# Patient Record
Sex: Female | Born: 1970 | Hispanic: No | Marital: Married | Smoking: Never smoker
Health system: Southern US, Community
[De-identification: ages and names within clinical notes are randomized; demographics above are authoritative.]

## PROBLEM LIST (undated history)

## (undated) DIAGNOSIS — K635 Polyp of colon: Secondary | ICD-10-CM

## (undated) HISTORY — DX: Polyp of colon: K63.5

---

## 2002-07-21 ENCOUNTER — Other Ambulatory Visit: Admission: RE | Admit: 2002-07-21 | Discharge: 2002-07-21 | Payer: Self-pay | Admitting: *Deleted

## 2002-11-27 ENCOUNTER — Inpatient Hospital Stay (HOSPITAL_COMMUNITY): Admission: AD | Admit: 2002-11-27 | Discharge: 2003-01-01 | Payer: Self-pay | Admitting: *Deleted

## 2002-11-29 ENCOUNTER — Encounter: Payer: Self-pay | Admitting: Obstetrics and Gynecology

## 2002-12-02 ENCOUNTER — Encounter: Payer: Self-pay | Admitting: Obstetrics and Gynecology

## 2002-12-05 ENCOUNTER — Encounter: Payer: Self-pay | Admitting: Obstetrics and Gynecology

## 2002-12-06 ENCOUNTER — Encounter: Payer: Self-pay | Admitting: Obstetrics and Gynecology

## 2002-12-07 ENCOUNTER — Encounter: Payer: Self-pay | Admitting: Obstetrics and Gynecology

## 2002-12-08 ENCOUNTER — Encounter: Payer: Self-pay | Admitting: Obstetrics and Gynecology

## 2002-12-09 ENCOUNTER — Encounter: Payer: Self-pay | Admitting: Obstetrics and Gynecology

## 2002-12-10 ENCOUNTER — Encounter: Payer: Self-pay | Admitting: Obstetrics and Gynecology

## 2002-12-11 ENCOUNTER — Encounter: Payer: Self-pay | Admitting: Obstetrics and Gynecology

## 2002-12-12 ENCOUNTER — Encounter: Payer: Self-pay | Admitting: Obstetrics and Gynecology

## 2002-12-13 ENCOUNTER — Encounter: Payer: Self-pay | Admitting: Obstetrics and Gynecology

## 2002-12-14 ENCOUNTER — Encounter: Payer: Self-pay | Admitting: *Deleted

## 2002-12-15 ENCOUNTER — Encounter: Payer: Self-pay | Admitting: Obstetrics and Gynecology

## 2002-12-16 ENCOUNTER — Encounter: Payer: Self-pay | Admitting: *Deleted

## 2002-12-17 ENCOUNTER — Encounter: Payer: Self-pay | Admitting: Obstetrics and Gynecology

## 2002-12-18 ENCOUNTER — Encounter: Payer: Self-pay | Admitting: Obstetrics and Gynecology

## 2002-12-19 ENCOUNTER — Encounter: Payer: Self-pay | Admitting: Obstetrics and Gynecology

## 2002-12-20 ENCOUNTER — Encounter: Payer: Self-pay | Admitting: Obstetrics and Gynecology

## 2002-12-21 ENCOUNTER — Encounter: Payer: Self-pay | Admitting: *Deleted

## 2002-12-22 ENCOUNTER — Encounter: Payer: Self-pay | Admitting: *Deleted

## 2002-12-23 ENCOUNTER — Encounter: Payer: Self-pay | Admitting: *Deleted

## 2002-12-24 ENCOUNTER — Encounter: Payer: Self-pay | Admitting: *Deleted

## 2002-12-25 ENCOUNTER — Encounter: Payer: Self-pay | Admitting: *Deleted

## 2002-12-28 ENCOUNTER — Encounter: Payer: Self-pay | Admitting: *Deleted

## 2002-12-29 ENCOUNTER — Encounter: Payer: Self-pay | Admitting: *Deleted

## 2002-12-29 ENCOUNTER — Encounter (INDEPENDENT_AMBULATORY_CARE_PROVIDER_SITE_OTHER): Payer: Self-pay | Admitting: Specialist

## 2003-01-02 ENCOUNTER — Encounter: Admission: RE | Admit: 2003-01-02 | Discharge: 2003-02-01 | Payer: Self-pay | Admitting: *Deleted

## 2003-01-29 ENCOUNTER — Other Ambulatory Visit: Admission: RE | Admit: 2003-01-29 | Discharge: 2003-01-29 | Payer: Self-pay | Admitting: *Deleted

## 2004-01-30 ENCOUNTER — Other Ambulatory Visit: Admission: RE | Admit: 2004-01-30 | Discharge: 2004-01-30 | Payer: Self-pay | Admitting: *Deleted

## 2008-10-31 ENCOUNTER — Ambulatory Visit (HOSPITAL_COMMUNITY): Admission: RE | Admit: 2008-10-31 | Discharge: 2008-10-31 | Payer: Self-pay | Admitting: Family Medicine

## 2010-11-06 ENCOUNTER — Emergency Department (HOSPITAL_COMMUNITY)
Admission: EM | Admit: 2010-11-06 | Discharge: 2010-11-07 | Disposition: A | Discharge status: Home / Self Care | Payer: Self-pay | Attending: Emergency Medicine | Admitting: Emergency Medicine

## 2010-11-06 DIAGNOSIS — R05 Cough: Secondary | ICD-10-CM | POA: Insufficient documentation

## 2010-11-06 DIAGNOSIS — R059 Cough, unspecified: Secondary | ICD-10-CM | POA: Insufficient documentation

## 2010-11-06 DIAGNOSIS — J40 Bronchitis, not specified as acute or chronic: Secondary | ICD-10-CM | POA: Insufficient documentation

## 2010-11-06 LAB — URINALYSIS, ROUTINE W REFLEX MICROSCOPIC
Nitrite: NEGATIVE
Specific Gravity, Urine: >1.03 — ABNORMAL HIGH (ref 1.005–1.030)
Urine Glucose, Fasting: NEGATIVE mg/dL
pH: 5 (ref 5.0–8.0)

## 2010-11-06 LAB — URINE MICROSCOPIC-ADD ON

## 2010-11-06 LAB — COMPREHENSIVE METABOLIC PANEL
Albumin: 4 g/dL (ref 3.5–5.2)
BUN: 9 mg/dL (ref 6–23)
Calcium: 9.4 mg/dL (ref 8.4–10.5)
Creatinine, Ser: 0.74 mg/dL (ref 0.4–1.2)
Potassium: 3.2 mEq/L — ABNORMAL LOW (ref 3.5–5.1)
Total Protein: 7.6 g/dL (ref 6.0–8.3)

## 2010-11-06 LAB — CBC
MCH: 31.4 pg (ref 26.0–34.0)
MCHC: 37.1 g/dL — ABNORMAL HIGH (ref 30.0–36.0)
MCV: 84.5 fL (ref 78.0–100.0)
Platelets: 201 10*3/uL (ref 150–400)
RDW: 12.6 % (ref 11.5–15.5)
WBC: 22.1 10*3/uL — ABNORMAL HIGH (ref 4.0–10.5)

## 2010-11-06 LAB — DIFFERENTIAL
Basophils Absolute: 0 10*3/uL (ref 0.0–0.1)
Eosinophils Absolute: 0 10*3/uL (ref 0.0–0.7)
Eosinophils Relative: 0 % (ref 0–5)
Lymphocytes Relative: 4 % — ABNORMAL LOW (ref 12–46)
Lymphs Abs: 0.9 10*3/uL (ref 0.7–4.0)
Monocytes Absolute: 1.3 10*3/uL — ABNORMAL HIGH (ref 0.1–1.0)
Monocytes Relative: 6 % (ref 3–12)
Neutrophils Relative %: 90 % — ABNORMAL HIGH (ref 43–77)

## 2010-11-08 ENCOUNTER — Emergency Department (HOSPITAL_COMMUNITY): Admit: 2010-11-08 | Discharge: 2010-11-08 | Disposition: A | Discharge status: Home / Self Care | Payer: Self-pay

## 2010-11-08 ENCOUNTER — Inpatient Hospital Stay (HOSPITAL_COMMUNITY)
Admission: EM | Admit: 2010-11-08 | Discharge: 2010-11-11 | DRG: 603 | Disposition: A | Discharge status: Home / Self Care | Payer: Self-pay | Attending: Internal Medicine | Admitting: Internal Medicine

## 2010-11-08 DIAGNOSIS — L988 Other specified disorders of the skin and subcutaneous tissue: Secondary | ICD-10-CM | POA: Diagnosis present

## 2010-11-08 DIAGNOSIS — E871 Hypo-osmolality and hyponatremia: Secondary | ICD-10-CM | POA: Diagnosis present

## 2010-11-08 DIAGNOSIS — D649 Anemia, unspecified: Secondary | ICD-10-CM | POA: Diagnosis present

## 2010-11-08 DIAGNOSIS — E876 Hypokalemia: Secondary | ICD-10-CM | POA: Diagnosis present

## 2010-11-08 DIAGNOSIS — L02419 Cutaneous abscess of limb, unspecified: Principal | ICD-10-CM | POA: Diagnosis present

## 2010-11-08 DIAGNOSIS — J209 Acute bronchitis, unspecified: Secondary | ICD-10-CM | POA: Diagnosis present

## 2010-11-08 DIAGNOSIS — L270 Generalized skin eruption due to drugs and medicaments taken internally: Secondary | ICD-10-CM | POA: Diagnosis not present

## 2010-11-08 DIAGNOSIS — T368X5A Adverse effect of other systemic antibiotics, initial encounter: Secondary | ICD-10-CM | POA: Diagnosis not present

## 2010-11-08 LAB — BASIC METABOLIC PANEL
BUN: 5 mg/dL — ABNORMAL LOW (ref 6–23)
Creatinine, Ser: 0.65 mg/dL (ref 0.4–1.2)
GFR calc non Af Amer: >60 mL/min (ref >60)
Glucose, Bld: 128 mg/dL — ABNORMAL HIGH (ref 70–99)

## 2010-11-08 LAB — DIFFERENTIAL
Basophils Relative: 0 % (ref 0–1)
Eosinophils Absolute: 0 10*3/uL (ref 0.0–0.7)
Eosinophils Relative: 0 % (ref 0–5)
Monocytes Relative: 3 % (ref 3–12)
Neutrophils Relative %: 93 % — ABNORMAL HIGH (ref 43–77)

## 2010-11-08 LAB — CBC
MCH: 31.2 pg (ref 26.0–34.0)
MCV: 85.5 fL (ref 78.0–100.0)
Platelets: 187 10*3/uL (ref 150–400)
RBC: 4.42 MIL/uL (ref 3.87–5.11)
RDW: 13.3 % (ref 11.5–15.5)

## 2010-11-08 NOTE — H&P (Signed)
[name], [name]            ACCOUNT NO.:  [id number]  MEDICAL RECORD NO.:  [id number]           PATIENT TYPE:  E  LOCATION:  [facility]                          FACILITY:  [facility]  PHYSICIAN:  Elliot Cousin, M.D.    DATE OF BIRTH:  01/22/1971  DATE OF ADMISSION:  11/08/2010 DATE OF DISCHARGE:  LH                             HISTORY & PHYSICAL   PRIMARY CARE PHYSICIAN:  [name], MD  CHIEF COMPLAINT:  Left leg redness, swelling, and pain.  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  Her symptoms started 2 days ago.  She presented to the Emergency Department at that time for mild left ankle redness and swelling.  She also complained of left inner thigh discomfort.  She does not recall complaining of a cough.  However, the Emergency Department physician diagnosed her with bronchitis and discharged her to home on ibuprofen and azithromycin.  The patient has no complaints of cough, chest pain or pleurisy at this time.  However, over the past 48 hours, her left leg has become more swollen, more painful, and more red.  The redness has now extended from the left foot to the knee and then to the inner thigh.  She has no pain at rest, however when she stands or attempts to ambulate, she experiences an achy pain which she rates as 7/10 in intensity.  The ibuprofen has helped. She received azithromycin in the Emergency Department 2 days ago.  She has taken only one azithromycin tablet that was prescribed yesterday. She has not taken any medications today.  Several days ago, she did have subjective fever and chills.  No associated nausea, vomiting, diarrhea, abdominal pain, headache, or pain with urination.  Of note, when questioned, the patient had been filing and  clipping her toenails.  The left fourth toe is slightly painful and swollen.  There was some mild bleeding around the cuticle several days ago when she clipped her toenails.  The patient presented to her primary care physician today in his office. He was concerned about a DVT.  He recommended that she come to the Emergency Department for further evaluation.  A lower extremity venous ultrasound was ordered and it was negative for DVT.  Currently, the patient is afebrile and hemodynamically stable.  Her white blood cell count is 16.5.  Her serum sodium is 133.  Her serum potassium is 3.4. She is being admitted for further evaluation and management.  PAST MEDICAL HISTORY: 1. 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 2. History of therapeutic abortion with secondary bleeding, status     post transfusion in 1998. 3. Status post C-section in March 2004.  MEDICATIONS: 1. Ibuprofen 800 mg 3 times daily as needed for pain and fever. 2. Azithromycin 250 mg daily.  ALLERGIES:  The patient has an allergy to PENICILLIN.  She was told byher mother that she had convulsions after taking PENICILLIN.  SOCIAL  HISTORY:  [place]  She is married.  She has 1 daughter.  She is employed at a [employer]. She denies tobacco, alcohol, and illicit drug use.  FAMILY HISTORY:  Her mother is 54 years of age and has no significant health problems.  Her father is 79 years of age and has no significant health problems.  REVIEW OF SYSTEMS:  As above in history present illness.  Otherwise review of systems is negative.  PHYSICAL EXAMINATION:  VITAL SIGNS:  Temperature 99.4, blood pressure 113/55, pulse 103 rechecked at 90 beats per minute, respiratory rate 17, and oxygen saturation 99% on room air. GENERAL:  The patient is a pleasant 40 year old Latino woman who is currently sitting up in bed in no acute distress. HEENT:  Head is normocephalic, nontraumatic.  Pupils  are equal, round, and reactive to light.  Extraocular muscles are intact.  Conjunctivae are clear.  Sclerae are white.  Tympanic membranes are not examined. Nasal mucosa is mildly dry.  No sinus tenderness.  Oropharynx reveals slightly dry mucous membranes.  No posterior exudates or erythema. NECK:  Supple.  No adenopathy.  No thyromegaly.  No bruit.  No JVD. LUNGS:  Clear to auscultation bilaterally. HEART:  S1, S2 with no murmurs, rubs, or gallops. ABDOMEN:  Positive bowel sounds, soft, nontender, and nondistended.  No hepatosplenomegaly.  No masses palpated. GENITOURINARY:  Deferred. RECTAL:  Deferred. EXTREMITIES:  Right lower extremity without any abnormalities.  Pedal pulse is palpable.  No pretibial or pedal edema.  The left leg has a large patch of erythema located at the inner thigh.  There is erythema over the left foot radiating to the pretibial area and the calf area. There is 2+ to 3+ pedal edema and approximately 1+ pretibial edema.  Her left leg is warm to touch.  Mild tenderness over the dorsum and the ankle of the foot.  Pedal pulse is barely palpable.  She is able to flex and extend the foot with some discomfort.  Of note, the patient's left fourth toe is mildly erythematous and edematous.  The cuticle on the toe is red and inflamed.  In fact, there are inflamed cuticles on all of the toes of her left foot. NEUROLOGIC:  The patient is alert and oriented x3.  Cranial nerves II through XII are intact.  Strength is 5/5 throughout with exception of the left leg, which was not tested for strength due to discomfort. Sensation is grossly intact.  ADMISSION IMAGING DATA:  Left lower extremity venous duplex ultrasound. No evidence of deep vein thrombosis.  ADMISSION LABORATORY DATA:  WBC 16.4, hemoglobin 13.8, and platelet count 187.  Sodium 133, potassium 3.4, chloride 104, CO2 of 21, glucose 128, BUN 5, creatinine 0.65, and calcium 9.1.  ASSESSMENT: 1. Left lower  extremity cellulitis.  The impetus or nidus of infection     is likely from the mildly infected left fourth toe, which has some     surrounding erythema.  The patient's cuticles are all mildly     erythematous on her left foot.  She was obviously filing and/or     clipping her toenails too close to the cuticle.  The patient was     informed of this.  She does have a leukocytosis.  Her white blood     cell count is currently 16.4.  Two days ago, it was 22.1.  She was     started on treatment with azithromycin and ibuprofen for what was  diagnosed as an acute bronchitis, although the patient has no upper     respiratory symptoms or lower respiratory symptoms now.  She has     been on azithromycin for the past 2 days.  Objectively, she is not     febrile, although she did have subjective fever and chills several     days ago. 2. Hyponatremia and hypokalemia.  The patient's serum sodium is 133     and her serum potassium is 3.4.  She may be mildly volume depleted. 3. Left lower extremity edema, secondary to cellulitis.  The venous     ultrasound was negative for deep vein thrombosis.  PLAN: 1. The patient was given intravenous clindamycin by the Emergency     Department physician Dr. Effie Shy.  We will discontinue clindamycin in     favor of treatment with IV Levaquin and vancomycin. 2. We will replete her potassium chloride orally and in the IV fluids.     We will gently hydrate her. 3. Supportive treatment.     Elliot Cousin, M.D.     DF/MEDQ  D:  11/08/2010  T:  11/08/2010  Job:  [id number]  cc:   [name], M.D. Fax: [phone]  Electronically Signed by Elliot Cousin M.D. on 11/08/2010 06:40:34 PM

## 2010-11-09 LAB — COMPREHENSIVE METABOLIC PANEL
ALT: 28 U/L (ref 0–35)
AST: 17 U/L (ref 0–37)
Albumin: 2.7 g/dL — ABNORMAL LOW (ref 3.5–5.2)
Alkaline Phosphatase: 174 U/L — ABNORMAL HIGH (ref 39–117)
BUN: 6 mg/dL (ref 6–23)
Chloride: 106 mEq/L (ref 96–112)
GFR calc Af Amer: >60 mL/min (ref >60)
Potassium: 3.9 mEq/L (ref 3.5–5.1)
Sodium: 134 mEq/L — ABNORMAL LOW (ref 135–145)
Total Bilirubin: 1.2 mg/dL (ref 0.3–1.2)
Total Protein: 5.9 g/dL — ABNORMAL LOW (ref 6.0–8.3)

## 2010-11-09 LAB — DIFFERENTIAL
Basophils Relative: 0 % (ref 0–1)
Eosinophils Absolute: 0 10*3/uL (ref 0.0–0.7)
Eosinophils Relative: 0 % (ref 0–5)
Lymphs Abs: 1.2 10*3/uL (ref 0.7–4.0)
Monocytes Absolute: 0.5 10*3/uL (ref 0.1–1.0)
Monocytes Relative: 6 % (ref 3–12)
Neutrophils Relative %: 79 % — ABNORMAL HIGH (ref 43–77)

## 2010-11-09 LAB — CBC
MCH: 31.2 pg (ref 26.0–34.0)
MCHC: 36.3 g/dL — ABNORMAL HIGH (ref 30.0–36.0)
MCV: 86 fL (ref 78.0–100.0)
Platelets: 168 10*3/uL (ref 150–400)
RBC: 3.43 MIL/uL — ABNORMAL LOW (ref 3.87–5.11)

## 2010-11-09 LAB — TSH: TSH: <0.008 u[IU]/mL — ABNORMAL LOW (ref 0.350–4.500)

## 2010-11-10 LAB — CBC
Hemoglobin: 11.1 g/dL — ABNORMAL LOW (ref 12.0–15.0)
MCH: 31.4 pg (ref 26.0–34.0)
MCHC: 36.8 g/dL — ABNORMAL HIGH (ref 30.0–36.0)
MCV: 85.3 fL (ref 78.0–100.0)
Platelets: 196 10*3/uL (ref 150–400)
RBC: 3.54 MIL/uL — ABNORMAL LOW (ref 3.87–5.11)

## 2010-11-10 LAB — TSH: TSH: <0.008 u[IU]/mL — ABNORMAL LOW (ref 0.350–4.500)

## 2010-11-10 LAB — BASIC METABOLIC PANEL
BUN: 5 mg/dL — ABNORMAL LOW (ref 6–23)
CO2: 23 mEq/L (ref 19–32)
Calcium: 8.6 mg/dL (ref 8.4–10.5)
Chloride: 110 mEq/L (ref 96–112)
Creatinine, Ser: 0.52 mg/dL (ref 0.4–1.2)

## 2010-11-10 LAB — DIFFERENTIAL
Eosinophils Absolute: 0.1 10*3/uL (ref 0.0–0.7)
Lymphs Abs: 1.7 10*3/uL (ref 0.7–4.0)
Monocytes Absolute: 0.6 10*3/uL (ref 0.1–1.0)
Monocytes Relative: 8 % (ref 3–12)
Neutrophils Relative %: 68 % (ref 43–77)

## 2010-11-10 LAB — IRON AND TIBC: UIBC: 213 ug/dL

## 2010-11-10 LAB — FERRITIN: Ferritin: 143 ng/mL (ref 10–291)

## 2010-11-13 LAB — CULTURE, BLOOD (ROUTINE X 2): Culture: NO GROWTH

## 2010-11-26 NOTE — Discharge Summary (Signed)
  [name]            ACCOUNT NO.:  [id number]  MEDICAL RECORD NO.:  [id number]           PATIENT TYPE:  I  LOCATION:  A325                          FACILITY:  [facility]  PHYSICIAN:  Marinda Elk, M.D.DATE OF BIRTH:  [birth year]  DATE OF ADMISSION:  11/08/2010 DATE OF DISCHARGE:  02/07/2012LH                              DISCHARGE SUMMARY   PRIMARY CARE DOCTOR:  [name], MD  DISCHARGE DIAGNOSIS: 1. Lower extremity cellulitis. 2. Acute bronchitis. 3. Hyponatremia. 4. Diffuse pruritus and skin rash, probably secondary to vancomycin,     questionable Red man syndrome. 5. Anemia.  DISCHARGE MEDICATIONS: 1. Tylenol 325 mg p.o. 650 mg daily. 2. Levaquin 500 mg p.o. daily. 3. Ibuprofen 800 mg 1 tab t.i.d. p.r.n.  PROCEDURES PERFORMED:  Doppler extremity showed no evidence of DVT.  BRIEF ADMITTING HISTORY AND PHYSICAL:  This is a 40 year old female with past medical history positive for PE, negative chest x-ray in 1998, who presents to emergency room with a chief complaint of left lower extremity swelling.  Since it started 2 days ago, he presented to the ED with a redness and ankle swelling, she complained also on the inner thigh.  She does not recall at calf.  She was diagnosed with bronchitis, given azithromycin and ibuprofen and discharged home.  Please refer to November 08, 2010, for further details.  ASSESSMENT AND PLAN: 1. Left lower extremity cellulitis.  On admission, she was started on     vancomycin.  She developed a rash and pruritus, diffuse.  This is     probably Red man syndrome.  This was stopped.  She was given     Benadryl with improvement, unsure if she has an allergy to     vancomycin.  So, she was put on Levaquin which she will continue     for 7 days.  Her redness had decreased.  Her inner thigh redness     resolved.  She is able to walk without any problem. 2. Acute bronchitis.  She came in with azithromycin.  This was  discontinued.  Levaquin should cover.  She was satting 97% on room     air. 3. Hyponatremia, this is probably secondary to decreased p.o. intake.     She was given IV fluids and this resolved.  On the day of discharge, temperature is 98, pulse of 88, respirations 20, blood pressure 106/60, she was satting 97% on room air.  LABS ON THE DAY OF DISCHARGE:  None.     Marinda Elk, M.D.     AF/MEDQ  D:  11/11/2010  T:  11/12/2010  Job:  [id number]  cc:   [name], M.D. Fax: [phone]  Electronically Signed by Lambert Keto M.D. on 11/26/2010 10:17:04 AM

## 2011-02-20 NOTE — Discharge Summary (Signed)
[name], [name]                        ACCOUNT NO.:  [id number]   MEDICAL RECORD NO.:  [id number]                   PATIENT TYPE:  INP   LOCATION:  9143                                 FACILITY:  [facility]   PHYSICIAN:  [place] B. Earlene Plater, M.D.               DATE OF BIRTH:  [birth year]   DATE OF ADMISSION:  11/27/2002  DATE OF DISCHARGE:  01/01/2003                                 DISCHARGE SUMMARY   ADMISSION DIAGNOSES:  1. Twenty-seven and four-sevenths weeks intrauterine pregnancy.  2. Oligohydramnios.  3. History of elevated maternal serum alpha-fetoprotein.   DISCHARGE DIAGNOSES:  1. Twenty-seven and four-sevenths weeks intrauterine pregnancy.  2. Oligohydramnios.  3. History of elevated maternal serum alpha-fetoprotein.  4. Preterm premature rupture of membranes.  5. Preterm labor.  6. Breech presentation.   PROCEDURE:  1. Admission for expectant management of oligohydramnios and subsequent     rupture of membranes.  2. Primary cesarean section for preterm labor with breech presentation.   HISTORY OF PRESENT ILLNESS:  For complete details please see the History and  Physical on the chart.  Briefly, the patient presented as a 40 year old  Hispanic female gravida 2 para 0 at 69 and four-sevenths weeks with  worsening oligohydramnios.  Had a history of first trimester bleeding with a  large subchorionic hemorrhage, and an abnormal triple screen with an alpha-  fetoprotein of 3.9 multiples of the median.  Subsequent ultrasound showed  severe oligohydramnios and otherwise normal anatomical survey.  The patient  had declined amniocentesis and was counseled regarding the risks of  continued pregnancy at [facility] by Dr. Kirby Funk.  Follow-up  ultrasound did show interval growth and eventual collection of a small  amount of amniotic fluid which peaked at an AFI of 7 and subsequently  diminished to an AFI of 4 which was criteria for admission.  The patient had  intermittently complained of leakage of fluid but on multiple sterile  speculum exams had never been shown to have definitive evidence of ruptured  membranes from 16 weeks on.   HOSPITAL COURSE:  The patient was admitted and managed with bedrest,  hydration, and daily fetal monitoring which was reassuring.  Ultrasound did  show interval growth and stabilization of her amniotic fluid index.   On hospital day #6 the patient noted dramatic increase in leakage of fluid  and associated slight bleeding.  Sterile speculum exam was repeated and this  time confirmed by microscopy rupture of membranes.  The patient was treated  by the PPROM protocol with erythromycin and clindamycin as she had a severe  PENICILLIN allergy.  This was continued intravenous x2 days and oral x5  days.  The patient continued to be managed expectantly until hospital day  #33 at which time she was 32 weeks.  At that point she developed worsening  contractions, increasing bleeding, and was found to be in preterm labor with  breech presentation.  Therefore, was recommended the patient should deliver  by cesarean section.   The patient was subsequently delivered by a primary low transverse cesarean  section of a viable female infant, Apgars 8 and 9.  There was a partially  abrupted placenta with organized clot and partial placenta accreta which was  ultimately removed by sharp curettage and noted at the time of surgery.  There also was a question of a right uterine horn.  This was difficult to  determine definitively as the uterus could not be exteriorized due to an  abnormal size and configuration with an elongated apparent right uterine  horn.   Postoperatively the patient rapidly regained her ability to ambulate, void,  and tolerate a regular diet.  She was discharged to home on postoperative  day #3 in satisfactory condition.   DISCHARGE MEDICATIONS:  1. Tylox one to two p.o. q.4-6h. p.r.n. pain.  2. Prenatal  vitamins one daily.   FOLLOW-UP:  [facility] in four to six weeks.   DISCHARGE INSTRUCTIONS:  Standard booklet given prior to discharge.   CONDITION ON DISCHARGE:  Satisfactory.                                               Gerri Spore B. Earlene Plater, M.D.    WBD/MEDQ  D:  01/24/2003  T:  01/24/2003  Job:  [id number]

## 2011-02-20 NOTE — Op Note (Signed)
[name], [name]                        ACCOUNT NO.:  [id number]   MEDICAL RECORD NO.:  [id number]                   PATIENT TYPE:  INP   LOCATION:  9143                                 FACILITY:  [facility]   PHYSICIAN:  Aurelia B. Earlene Plater, M.D.               DATE OF BIRTH:  [birth year]   DATE OF PROCEDURE:  12/29/2002  DATE OF DISCHARGE:                                 OPERATIVE REPORT   PREOPERATIVE DIAGNOSES:  1. Thirty-two week intrauterine pregnancy.  2. Premature preterm rupture of membranes.  3. Preterm labor.  4. Breech presentation.  5. Vaginal bleeding.   POSTOPERATIVE DIAGNOSES:  1. Thirty-two week intrauterine pregnancy.  2. Premature preterm rupture of membranes.  3. Preterm labor.  4. Breech presentation.  5. Vaginal bleeding.   PROCEDURE:  Primary low transverse cesarean section.   SURGEON:  Chester Holstein. Earlene Plater, M.D.   ASSISTANT:  Thamas Jaegers, MS 3.   ANESTHESIA:  Spinal.   FINDINGS:  Viable female infant, Apgars 8 and 9.  Uterus with questionable  right uterine horn, placenta accreta within the area of the presumed right  uterine horn, and clinically abruptio placentae at an area of organized old  apparent clot was noted when the placenta was removed.  Tubes and ovaries  not well visualized.   ESTIMATED BLOOD LOSS:  500 cc.   COMPLICATIONS:  None.   INDICATIONS FOR PROCEDURE:  The patient had been hospitalization for acute  problem of oligohydramnios status post ___________ now at 32 weeks' with  increasing contractions today.  Had previously had a long closed cervix by  speculum exam.  Contractions were noted to increase throughout the day today  to every two minutes with associated heavy vaginal bleeding.  Was found to  be 1 cm dilated, completely effaced, and had previously been verified as  breech earlier in the day on ultrasound.  Therefore, taken for a primary  cesarean section.   PROCEDURE:  The patient was taken to the operating room and  spinal  anesthesia obtained.  She was placed in the supine position with leftward  tilt and prepped and draped in standard fashion.  A Foley catheter was  placed into the bladder.   A Pfannenstiel incision was made with a knife and carried sharply to the  underlying fascia.  The fascia was divided in the midline, extended  laterally with Mayo scissors.  Kocher clamps used to elevate the superior  aspect of the incision and underlying rectus muscles dissected off sharply,  repeated ___________ in similar fashion.   The midline was identified at the superior aspect of the incision and  peritoneum entered sharply after elevation with hemostats, extended  inferiorly with the Bovie, bladder blade inserted, vesicouterine peritoneum  identified, bladder flap created with sharp and blunt technique.  The  bladder blade reinserted and the uterine incision made in a low transverse  fashion with a knife, extended laterally with bandage  scissors.  At  amniotomy, clear fluid noted.   The breech was elevated through the incision and delivered to the level of  the knees.  The knees were then flexed to deliver the lower extremities.  Traction was placed at the posterior, superior iliac crests  to deliver the  fetus to the level of the scapulae.  Each arm was delivered by collection at  the elbow.  The head was delivered by flexion at the neck.  Care was taken  to avoid hyperextension of the neck.  The nose and mouth suctioned with the  bulb and the infant handed off to waiting pediatricians.  Clindamycin 900 mg  IV was given at cord clamp due to penicillin allergy.   The uterus could not be exteriorized due to an abnormal size and  configuration with an elongated apparent right uterine horn.  This was where  the placenta was located and it was abnormally implanted.  Clinically,  diagnosis is of a placenta accreta.  This was removed with ring forceps with  significant effort necessary.  The #12 suction  cannula was placed up into  the horn and suction revealed no additional tissue returning.  It was gently  curetted and a gritty texture noted.  There was no excessive bleeding.   The uterine incision was free of extension.  It was closed in a running-  locked fashion with 0 Monocryl.  A second imbricating layer placed over the  same suture.   The pelvis was irrigated, the uterine incision inspected and was hemostatic,  as was the bladder flap.  The subfascial space was also hemostatic.   The fascia was closed with running stitch of 0 Vicryl.  The subcutaneous  tissue was irrigated and made hemostatic with the Bovie.  The skin was  closed with staples.   The patient tolerated the procedure well, there were no complications.  She  was taken to the recovery room awake, alert, and in stable condition.  All  counts were correct per the operating staff.                                               Gerri Spore B. Earlene Plater, M.D.    WBD/MEDQ  D:  12/29/2002  T:  12/30/2002  Job:  [id number]

## 2011-02-20 NOTE — H&P (Signed)
[name], [name]                        ACCOUNT NO.:  [id number]   MEDICAL RECORD NO.:  [id number]                   PATIENT TYPE:  INP   LOCATION:  9156                                 FACILITY:  [facility]   PHYSICIAN:  Lenoard Aden, M.D.             DATE OF BIRTH:  08/20/1971   DATE OF ADMISSION:  11/27/2002  DATE OF DISCHARGE:                                HISTORY & PHYSICAL   CHIEF COMPLAINT:  Oligohydramnios.   HISTORY OF PRESENT ILLNESS:  The patient is a 40 year old, Hispanic female,  G2, P0 with EDD of Feb 23, 2003, with an estimated gestational age of 27-4/7  weeks who presents with worsening oligohydramnios.   MEDICATIONS:  Prenatal vitamins.   ALLERGIES:  PENICILLIN.   PRENATAL LABORATORY DATA:  Blood type B positive.  Rh antibody negative.  Hemoglobin electrophoresis within normal limits.  Rubella immune.  Hepatitis  B surface antigen negative.  HIV nonreactive.   PAST MEDICAL HISTORY:  1. 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  No other medical     hospitalizations.  2. History of a TAB with secondary bleeding done in 1998.  3. Family history of insulin-dependent diabetes and chronic hypertension.  4. The patient did have a blood transfusion after bleeding complications     from her abortion in 1998.   PRENATAL COURSE:  Complicated by first-trimester bleeding with a large  subchorionic hemorrhage which resolved.  History of abnormal AFP of 3.9.  Subsequent targeted ultrasound revealed severe oligohydramnios and otherwise  normal, but suboptimal anatomical survey.  The patient declined  amniocentesis at that time.  Repeat ultrasound in this office performed on  November 06, 2002, reveals an estimated fetal weight in the 25th percentile  of 1 pound 8 ounces with AFI of 7 with left fetal renal pyelectasis of 0.6  cm noted.  Repeat ultrasound today reveals an AFI of 4.   PHYSICAL EXAMINATION:  GENERAL:  Well-developed, well-nourished,  Hispanic  female in no apparent distress.  VITAL SIGNS:  Blood pressure 100/60, weight 151 pounds.  HEENT:  Normal.  LUNGS:  Clear.  HEART:  Regular rate and rhythm.  ABDOMEN:  Soft, gravid to approximately 21-22 weeks, nontender.  PELVIC:  Cervix per nurse practitioner is closed, approximately 80% effaced  and -1 station.  Ultrasound today reveals an AFI of 4 and a frank breech  presenting fetus.  EXTREMITIES:  No cords.  NEUROLOGIC:  Nonfocal.   IMPRESSION:  1. A 27-4/7 weeks intrauterine pregnancy.  2. Homero Fellers breech presentation.  3. Oligohydramnios.  4. History of abnormal alpha fetoprotein with suboptimal target ultrasound.  5. History of tuberculosis with negative chest x-ray.  6. History of first-trimester bleeding.   PLAN:  1. Admit to [facility] for continuous fetal monitoring.  2. Repeat AFI in 48 hours.  3. IV fluids and hydration.  4. Bed rest with bathroom privileges.  5. Treat bacterial vaginosis with Flagyl  500 mg b.i.d. x7 days.  6. Betamethasone 12.5 mg IM, repeat in 24 hours.                                               Lenoard Aden, M.D.    RJT/MEDQ  D:  11/27/2002  T:  11/27/2002  Job:  [id number]

## 2012-03-28 ENCOUNTER — Other Ambulatory Visit (HOSPITAL_COMMUNITY): Payer: Self-pay | Admitting: Family Medicine

## 2012-03-28 DIAGNOSIS — Z139 Encounter for screening, unspecified: Secondary | ICD-10-CM

## 2012-03-29 ENCOUNTER — Ambulatory Visit (HOSPITAL_COMMUNITY): Payer: Self-pay

## 2012-03-30 ENCOUNTER — Ambulatory Visit (HOSPITAL_COMMUNITY): Payer: Self-pay

## 2012-06-20 ENCOUNTER — Ambulatory Visit (HOSPITAL_COMMUNITY): Payer: Self-pay

## 2012-06-20 ENCOUNTER — Ambulatory Visit (HOSPITAL_COMMUNITY)
Admission: RE | Admit: 2012-06-20 | Discharge: 2012-06-20 | Disposition: A | Discharge status: Home / Self Care | Payer: BC Managed Care – PPO | Source: Ambulatory Visit | Attending: Family Medicine | Admitting: Family Medicine

## 2012-06-20 DIAGNOSIS — Z1231 Encounter for screening mammogram for malignant neoplasm of breast: Secondary | ICD-10-CM | POA: Insufficient documentation

## 2012-06-20 DIAGNOSIS — Z139 Encounter for screening, unspecified: Secondary | ICD-10-CM

## 2012-07-18 ENCOUNTER — Other Ambulatory Visit (HOSPITAL_COMMUNITY): Payer: Self-pay | Admitting: Family Medicine

## 2012-07-18 ENCOUNTER — Ambulatory Visit (HOSPITAL_COMMUNITY)
Admission: RE | Admit: 2012-07-18 | Discharge: 2012-07-18 | Disposition: A | Discharge status: Home / Self Care | Payer: BC Managed Care – PPO | Source: Ambulatory Visit | Attending: Family Medicine | Admitting: Family Medicine

## 2012-07-18 DIAGNOSIS — R1084 Generalized abdominal pain: Secondary | ICD-10-CM

## 2012-07-18 DIAGNOSIS — R109 Unspecified abdominal pain: Secondary | ICD-10-CM | POA: Insufficient documentation

## 2012-07-18 DIAGNOSIS — K5900 Constipation, unspecified: Secondary | ICD-10-CM | POA: Insufficient documentation

## 2012-12-02 ENCOUNTER — Encounter: Payer: Self-pay | Admitting: *Deleted

## 2012-12-02 ENCOUNTER — Telehealth: Payer: Self-pay | Admitting: Internal Medicine

## 2012-12-02 NOTE — Telephone Encounter (Signed)
Spoke with patient and scheduled her with Doug Sou, PA on 12/09/12 at 1:30 PM.

## 2012-12-09 ENCOUNTER — Ambulatory Visit: Payer: BC Managed Care – PPO | Admitting: Gastroenterology

## 2012-12-12 ENCOUNTER — Ambulatory Visit (INDEPENDENT_AMBULATORY_CARE_PROVIDER_SITE_OTHER): Payer: BC Managed Care – PPO | Admitting: Gastroenterology

## 2012-12-12 ENCOUNTER — Other Ambulatory Visit (INDEPENDENT_AMBULATORY_CARE_PROVIDER_SITE_OTHER): Payer: BC Managed Care – PPO

## 2012-12-12 VITALS — BP 102/72 | HR 71 | Ht 61.5 in | Wt 135.5 lb

## 2012-12-12 DIAGNOSIS — K625 Hemorrhage of anus and rectum: Secondary | ICD-10-CM

## 2012-12-12 DIAGNOSIS — K5900 Constipation, unspecified: Secondary | ICD-10-CM

## 2012-12-12 DIAGNOSIS — R194 Change in bowel habit: Secondary | ICD-10-CM

## 2012-12-12 DIAGNOSIS — R198 Other specified symptoms and signs involving the digestive system and abdomen: Secondary | ICD-10-CM

## 2012-12-12 MED ORDER — NA SULFATE-K SULFATE-MG SULF 17.5-3.13-1.6 GM/177ML PO SOLN: ORAL | Status: DC

## 2012-12-12 NOTE — Patient Instructions (Signed)
You have been scheduled for a colonoscopy with propofol. Please follow written instructions given to you at your visit today.  Please use the suprep kit you have been given today. If you use inhalers (even only as needed), please bring them with you on the day of your procedure.  Your physician has requested that you go to the basement for the following lab work before leaving today: TSH, IGA  Take your Miralax every day.

## 2012-12-12 NOTE — Progress Notes (Signed)
12/12/2012 [name] [id number] [birth year]   HISTORY OF PRESENT ILLNESS:  Patient is a pleasant 42 year old female who was referred by her PCP for evaluation of rectal bleeding and constipation.  Says that two years ago her bowels changed and she began having to strain to move her bowels.  She says that she usually has 2 BM's a day but it is hard for her to pass them.  She says that if she drinks a lot of water then it is a little better.  Her PCP also gave her a script for Miralax, which she took for a short period of time before discontinuing it.  Thinks that it may have helped to some degree.  A couple of months ago she was having some BRB on the TP.  Her PCP gave her anusol suppositories but referred her here for further evaluation.  Complains of intermittent mid-abdominal pains as well, mostly when she eats meat.  CBC, CMP, lipase, and celiac titers were normal recently.  Denies nausea and vomiting.  No upper GI complaints such has heartburn or reflux.   History reviewed. No pertinent past medical history. Past Surgical History  Procedure Laterality Date  . Cesarean section      reports that she has never smoked. She has never used smokeless tobacco. She reports that she does not drink alcohol or use illicit drugs. family history includes Diabetes in her brother and maternal grandmother. Allergies  Allergen Reactions  . Penicillins Hives      Outpatient Encounter Prescriptions as of 12/12/2012  Medication Sig Dispense Refill  . hydrocortisone (ANUSOL-HC) 2.5 % rectal cream Place 1 application rectally 2 (two) times daily.      . polyethylene glycol powder (GLYCOLAX/MIRALAX) powder Take 17 g by mouth daily.      . Na Sulfate-K Sulfate-Mg Sulf (SUPREP BOWEL PREP) SOLN Use as directed  2 Bottle  0   No facility-administered encounter medications on file as of 12/12/2012.     REVIEW OF SYSTEMS  : All other systems reviewed and negative except where noted in the History of Present  Illness.   PHYSICAL EXAM: BP 102/72  Pulse 71  Ht 5' 1.5" (1.562 m)  Wt 135 lb 8 oz (61.462 kg)  BMI 25.19 kg/m2 General: Well developed female in no acute distress Head: Normocephalic and atraumatic Eyes:  sclerae anicteric,conjunctive pink. Ears: Normal auditory acuity Lungs: Clear throughout to auscultation Heart: Regular rate and rhythm Abdomen: Soft, non-distended. No masses or hepatomegaly noted. Normal bowel sounds.  Mild mid-abdominal TTP without R/R/G. Rectal: Deferred.  Will be done at the time of colonoscopy. Musculoskeletal: Symmetrical with no gross deformities  Skin: No lesions on visible extremities Extremities: No edema  Neurological: Alert oriented x 4, grossly nonfocal. Psychological:  Alert and cooperative. Normal mood and affect  ASSESSMENT AND PLAN: -Rectal bleeding -Change of bowels with constipation since 2 years ago.  Better with increased fluids. -Mid-abdominal discomfort:  Likely has IBS/idiopathic constipation.  *Will schedule colonoscopy. *Check TSH since this has not been checked in two years (was normal at that time). *She will continue the increased fluids and will restart back on Miralax once daily. *

## 2012-12-13 ENCOUNTER — Encounter: Payer: Self-pay | Admitting: Gastroenterology

## 2012-12-13 ENCOUNTER — Telehealth: Payer: Self-pay

## 2012-12-13 ENCOUNTER — Ambulatory Visit (AMBULATORY_SURGERY_CENTER): Payer: BC Managed Care – PPO | Admitting: Gastroenterology

## 2012-12-13 ENCOUNTER — Encounter: Payer: Self-pay | Admitting: Internal Medicine

## 2012-12-13 VITALS — BP 106/71 | HR 65 | Temp 98.8°F | Resp 27 | Ht 61.0 in | Wt 135.0 lb

## 2012-12-13 DIAGNOSIS — R198 Other specified symptoms and signs involving the digestive system and abdomen: Secondary | ICD-10-CM

## 2012-12-13 DIAGNOSIS — D126 Benign neoplasm of colon, unspecified: Secondary | ICD-10-CM

## 2012-12-13 DIAGNOSIS — Z1211 Encounter for screening for malignant neoplasm of colon: Secondary | ICD-10-CM

## 2012-12-13 DIAGNOSIS — K625 Hemorrhage of anus and rectum: Secondary | ICD-10-CM

## 2012-12-13 DIAGNOSIS — K6289 Other specified diseases of anus and rectum: Secondary | ICD-10-CM

## 2012-12-13 MED ORDER — SODIUM CHLORIDE 0.9 % IV SOLN: 500.0000 mL | INTRAVENOUS | Status: DC

## 2012-12-13 NOTE — Progress Notes (Signed)
Called to room to assist during endoscopic procedure.  Patient ID and intended procedure confirmed with present staff. Received instructions for my participation in the procedure from the performing physician.  

## 2012-12-13 NOTE — Progress Notes (Signed)
Patient did not experience any of the following events: a burn prior to discharge; a fall within the facility; wrong site/side/patient/procedure/implant event; or a hospital transfer or hospital admission upon discharge from the facility. (G8907)Patient did not have preoperative order for IV antibiotic SSI prophylaxis. (G8918) EWM 

## 2012-12-13 NOTE — Telephone Encounter (Signed)
[facility] pharmacy called with question regarding suprep , canceled the rx with pharmacy, patient was given sample since having procedure 12/13/12.

## 2012-12-13 NOTE — Op Note (Signed)
[facility] [address] [place], [address]   COLONOSCOPY PROCEDURE REPORT  PATIENT: [name], [name]  MR#: [id number] BIRTHDATE: 06-07-1971 , 41  yrs. old GENDER: Female ENDOSCOPIST: Mardella Layman, MD, [facility] REFERRED BY:  Assunta Found, M.D. PROCEDURE DATE:  12/13/2012 PROCEDURE:   Colonoscopy with biopsy and snare polypectomy ASA CLASS:   Class II INDICATIONS:Rectal Bleeding and Average risk patient for colon cancer. MEDICATIONS: propofol (Diprivan) 300mg  IV  DESCRIPTION OF PROCEDURE:   After the risks and benefits and of the procedure were explained, informed consent was obtained.  A digital rectal exam revealed no abnormalities of the rectum.    The LB CF-H180AL [id number]  endoscope was introduced through the anus and advanced to the cecum, which was identified by both the appendix and ileocecal valve .  The quality of the prep was excellent, using MoviPrep .  The instrument was then slowly withdrawn as the colon was fully examined.     COLON FINDINGS: A smooth sessile very flat polyp ranging between 3-35mm in size was found at the cecum.  A polypectomy was performed. The resection was complete and the polyp tissue was completely retrieved.   Granular,red rectal mucosa biopsied.   The colon was otherwise normal.  There was no diverticulosis, inflammation, polyps or cancers unless previously stated.            The scope was then withdrawn from the patient and the procedure completed.  COMPLICATIONS: There were no complications. ENDOSCOPIC IMPRESSION: 1.   Sessile polyp ranging between 3-65mm in size was found at the cecum; polypectomy was performed ...r/o adenoma 2.   Granular,red rectal mucosa biopsied.... Mild proctitis 3.   The colon was otherwise normal  RECOMMENDATIONS: Await pathology results Canasa supp qhs for 2 weeks...office f/u 2-3 weeks  REPEAT EXAM:  cc:    Doug Sou ,PA  _______________________________ eSigned:  Mardella Layman, MD, [facility] 12/13/2012 3:45 PM

## 2012-12-13 NOTE — Patient Instructions (Addendum)
YOU HAD AN ENDOSCOPIC PROCEDURE TODAY AT THE [facility]: Refer to the procedure report that was given to you for any specific questions about what was found during the examination.  If the procedure report does not answer your questions, please call your gastroenterologist to clarify.  If you requested that your care partner not be given the details of your procedure findings, then the procedure report has been included in a sealed envelope for you to review at your convenience later.  YOU SHOULD EXPECT: Some feelings of bloating in the abdomen. Passage of more gas than usual.  Walking can help get rid of the air that was put into your GI tract during the procedure and reduce the bloating. If you had a lower endoscopy (such as a colonoscopy or flexible sigmoidoscopy) you may notice spotting of blood in your stool or on the toilet paper. If you underwent a bowel prep for your procedure, then you may not have a normal bowel movement for a few days.  DIET: Your first meal following the procedure should be a light meal and then it is ok to progress to your normal diet.  A half-sandwich or bowl of soup is an example of a good first meal.  Heavy or fried foods are harder to digest and may make you feel nauseous or bloated.  Likewise meals heavy in dairy and vegetables can cause extra gas to form and this can also increase the bloating.  Drink plenty of fluids but you should avoid alcoholic beverages for 24 hours.  ACTIVITY: Your care partner should take you home directly after the procedure.  You should plan to take it easy, moving slowly for the rest of the day.  You can resume normal activity the day after the procedure however you should NOT DRIVE or use heavy machinery for 24 hours (because of the sedation medicines used during the test).    SYMPTOMS TO REPORT IMMEDIATELY: A gastroenterologist can be reached at any hour.  During normal business hours, 8:30 AM to 5:00 PM Monday through Friday,  call [phone].  After hours and on weekends, please call the GI answering service at [phone]  EMERGENCY NUMBER who will take a message and have the physician on call contact you.   Following lower endoscopy (colonoscopy or flexible sigmoidoscopy):  Excessive amounts of blood in the stool  Significant tenderness or worsening of abdominal pains  Swelling of the abdomen that is new, acute  Fever of 100F or higher  FOLLOW UP: If any biopsies were taken you will be contacted by phone or by letter within the next 1-3 weeks.  Call your gastroenterologist if you have not heard about the biopsies in 3 weeks.  Our staff will call the home number listed on your records the next business day following your procedure to check on you and address any questions or concerns that you may have at that time regarding the information given to you following your procedure. This is a courtesy call and so if there is no answer at the home number and we have not heard from you through the emergency physician on call, we will assume that you have returned to your regular daily activities without incident.  SIGNATURES/CONFIDENTIALITY: You and/or your care partner have signed paperwork which will be entered into your electronic medical record.  These signatures attest to the fact that that the information above on your After Visit Summary has been reviewed and is understood.  Full responsibility of the  confidentiality of this discharge information lies with you and/or your care-partner.  HANDOUTS ON POLYPS AND PROCTITIS  START CANASA SUPPOSITORIES 1 AT BEDTIME FOR 2 WEEKS  SEE DR PATTERSON IN HIS OFFICE IN 2-3 WEEKS-CALL [phone] TO SCHEDULE THIS IN 1-3 BUSINESS DAYS

## 2012-12-14 ENCOUNTER — Telehealth: Payer: Self-pay | Admitting: *Deleted

## 2012-12-14 NOTE — Telephone Encounter (Signed)
  Follow up Call-  Call back number 12/13/2012  Post procedure Call Back phone  # [phone]  Permission to leave phone message Yes     Patient questions:  Do you have a fever, pain , or abdominal swelling? no Pain Score  0 *  Have you tolerated food without any problems? yes  Have you been able to return to your normal activities? yes  Do you have any questions about your discharge instructions: Diet   no Medications  no Follow up visit  no  Do you have questions or concerns about your Care? no  Actions: * If pain score is 4 or above: No action needed, pain <4.

## 2012-12-19 ENCOUNTER — Encounter: Payer: Self-pay | Admitting: Gastroenterology

## 2012-12-20 ENCOUNTER — Telehealth: Payer: Self-pay | Admitting: Gastroenterology

## 2012-12-20 ENCOUNTER — Other Ambulatory Visit: Payer: Self-pay | Admitting: *Deleted

## 2012-12-20 ENCOUNTER — Telehealth: Payer: Self-pay | Admitting: *Deleted

## 2012-12-20 MED ORDER — MESALAMINE 1000 MG RE SUPP: 1000.0000 mg | Freq: Every day | RECTAL | Status: DC

## 2012-12-20 MED ORDER — HYDROCORTISONE 2.5 % RE CREA: 1.0000 "application " | TOPICAL_CREAM | Freq: Two times a day (BID) | RECTAL | Status: DC

## 2012-12-20 NOTE — Telephone Encounter (Signed)
Procedure note from Dr. Jarold Motto:: Canasa supp qhs for 2 weeks...office f/u 2-3 weeks.  Patient has follow up 01-2013.  RX sent.

## 2012-12-20 NOTE — Telephone Encounter (Signed)
RX sent

## 2012-12-28 ENCOUNTER — Encounter: Payer: Self-pay | Admitting: *Deleted

## 2013-01-03 ENCOUNTER — Ambulatory Visit (INDEPENDENT_AMBULATORY_CARE_PROVIDER_SITE_OTHER): Payer: BC Managed Care – PPO | Admitting: Gastroenterology

## 2013-01-03 ENCOUNTER — Encounter: Payer: Self-pay | Admitting: Gastroenterology

## 2013-01-03 VITALS — BP 106/78 | HR 63 | Ht 61.0 in | Wt 135.0 lb

## 2013-01-03 DIAGNOSIS — K625 Hemorrhage of anus and rectum: Secondary | ICD-10-CM

## 2013-01-03 DIAGNOSIS — K5120 Ulcerative (chronic) proctitis without complications: Secondary | ICD-10-CM

## 2013-01-03 DIAGNOSIS — Z860100 Personal history of colon polyps, unspecified: Secondary | ICD-10-CM

## 2013-01-03 DIAGNOSIS — R194 Change in bowel habit: Secondary | ICD-10-CM

## 2013-01-03 DIAGNOSIS — R198 Other specified symptoms and signs involving the digestive system and abdomen: Secondary | ICD-10-CM

## 2013-01-03 DIAGNOSIS — K6289 Other specified diseases of anus and rectum: Secondary | ICD-10-CM

## 2013-01-03 MED ORDER — MESALAMINE 1000 MG RE SUPP: 1000.0000 mg | Freq: Every day | RECTAL | Status: DC

## 2013-01-03 NOTE — Progress Notes (Signed)
This is a 42 year old Hispanic female with a one-month of periodic bright red blood per rectum with some anal pain.  Colonoscopy revealed a right colon adenoma which was excised, also mild proctitis.  She's been on Canasa 1 g suppositories with mild improvement but continues with some constipation, and anal discomfort.  She denies other gastrointestinal or systemic complaints.  Current Medications, Allergies, Past Medical History, Past Surgical History, Family History and Social History were reviewed in [employer] record.  ROS: All systems were reviewed and are negative unless otherwise stated in the HPI.          Physical Exam: Blood pressure 106/78, pulse 63 and regular, weight 135 pounds and BMI of 25.52.  Abdominal exam shows no organomegaly, masses or tenderness.  Specks of rectum shows some external redundant skin tags around the anus but no definite fissures or fistulae.  Rectal exam shows no masses or tenderness.  Stool is guaiac-negative.  Anoscopy: In the posterior lateral position there is a erosion regime with surrounding erythema and edema.  Anoscopy exam otherwise unremarkable.    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  I have scheduled her for followup colonoscopy in 3 years.  She is to continue Canasa 1 g suppositories at bedtime for 2 weeks, then every other day for 2 weeks, and I've added Colace 100 milligrams twice a day. No diagnosis found.

## 2013-01-03 NOTE — Patient Instructions (Addendum)
Please continue your Canasa suppositories at bedtime. Refill was sent to your pharmacy.  Please purchase Colace over the counter to use as directed by Dr. Jarold Motto   Please follow up in one year with Dr. Jarold Motto. ______________________________________________________________________________________________________________________________________________________________________________________________________                                               We are excited to introduce MyChart, a new best-in-class service that provides you online access to important information in your electronic medical record. We want to make it easier for you to view your health information - all in one secure location - when and where you need it. We expect MyChart will enhance the quality of care and service we provide.  When you register for MyChart, you can:    View your test results.    Request appointments and receive appointment reminders via email.    Request medication renewals.    View your medical history, allergies, medications and immunizations.    Communicate with your physician's office through a password-protected site.    Conveniently print information such as your medication lists.  To find out if MyChart is right for you, please talk to a member of our clinical staff today. We will gladly answer your questions about this free health and wellness tool.  If you are age 19 or older and want a member of your family to have access to your record, you must provide written consent by completing a proxy form available at our office. Please speak to our clinical staff about guidelines regarding accounts for patients younger than age 77.  As you activate your MyChart account and need any technical assistance, please call the MyChart technical support line at [phone]) or email your question to [email or web address]. If you email your question(s), please include your  name, a return phone number and the best time to reach you.  If you have non-urgent health-related questions, you can send a message to our office through MyChart at [email or web address]. If you have a medical emergency, call 911.  Thank you for using MyChart as your new health and wellness resource!   MyChart licensed from [employer],  [id number]. Patents Pending. _______________________________________________________________________________________________________________________________________  Proctitis Proctitis is the swelling and soreness (inflammation) of the lining of the rectum. The rectum is at the end of the large intestine and is attached to the anus. The inflammation causes pain and discomfort. It may be short-term (acute) or long-lasting (chronic). CAUSES Inflammation in the rectum can be caused by many things, such as:  Sexually transmitted diseases (STDs).  Infection.  Anal-rectal trauma or injury.  Ulcerative colitis or Crohn's disease.  Radiation therapy directed near the rectum.  Antibiotic therapy. SYMPTOMS  Sudden, uncomfortable, and frequent urge to have a bowel movement.  Anal or rectal pain.  Abdominal cramping or pain.  Sensation that the rectum is full.  Rectal bleeding.  Pus or mucus discharge from anus.  Diarrhea or frequent soft, loose stools. DIAGNOSIS Diagnosis may include the following:  A history and physical exam.  An STD test.  Blood tests.  Stool tests.  Rectal culture.  A procedure to evaluate the anal canal (anoscopy).  Procedures to look at part, or the entire large bowel (sigmoidoscopy, colonoscopy). TREATMENT Treatment of proctitis depends on the cause. Reducing the symptoms of inflammation and eliminating infection are the main goals of  treatment. Treatment may include:  Home remedies and lifestyle, such as sitz baths and avoiding food right before bedtime.  Topical ointments, foams,  suppositories, or enemas, such as corticosteroids or anti-inflammatories.  Antibiotic or antiviral medicines to treat infection or to control harmful bacteria.  Medicines to control diarrhea, soften stools, and reduce pain.  Medicines to suppress the immune system.  Avoiding the activity that caused rectal trauma.  Nutritional, dietary, or herbal supplements.  Heat or laser therapy for persistent bleeding.  A dilation procedure to enlarge a narrowed rectum.  Surgery, though rare, may be necessary to repair damaged rectal lining. HOME CARE INSTRUCTIONS Only take medicines that are recommended or approved by your caregiver.Do not take anti-diarrhea medicine without your caregiver's approval. SEEK MEDICAL CARE IF:  You often experience one or more of the symptoms noted above.  You keep experiencing symptoms after treatment.  You have questions or concerns about your symptoms or treatment plan. MAKE SURE YOU:  Understand these instructions.  Will watch your condition.  Will get help right away if you are not doing well or get worse. FOR MORE INFORMATION National Institute of Diabetes and Digestive and Kidney Disease (NIDDK): www.digestive.https://bradley.com/ Document Released: 09/10/2011 Document Revised: 12/14/2011 Document Reviewed: 09/10/2011 [employer] Patient Information 2013 [employer], [place]. __________________________________________________________________________________________________________________  Colon Polyps Polyps are lumps of extra tissue growing inside the body. Polyps can grow in the large intestine (colon). Most colon polyps are noncancerous (benign). However, some colon polyps can become cancerous over time. Polyps that are larger than a pea may be harmful. To be safe, caregivers remove and test all polyps. CAUSES  Polyps form when mutations in the genes cause your cells to grow and divide even though no more tissue is needed. RISK FACTORS There are a number of  risk factors that can increase your chances of getting colon polyps. They include:  Being older than 50 years.  Family history of colon polyps or colon cancer.  Long-term colon diseases, such as colitis or Crohn disease.  Being overweight.  Smoking.  Being inactive.  Drinking too much alcohol. SYMPTOMS  Most small polyps do not cause symptoms. If symptoms are present, they may include:  Blood in the stool. The stool may look dark red or black.  Constipation or diarrhea that lasts longer than 1 week. DIAGNOSIS People often do not know they have polyps until their caregiver finds them during a regular checkup. Your caregiver can use 4 tests to check for polyps:  Digital rectal exam. The caregiver wears gloves and feels inside the rectum. This test would find polyps only in the rectum.  Barium enema. The caregiver puts a liquid called barium into your rectum before taking X-rays of your colon. Barium makes your colon look white. Polyps are dark, so they are easy to see in the X-ray pictures.  Sigmoidoscopy. A thin, flexible tube (sigmoidoscope) is placed into your rectum. The sigmoidoscope has a light and tiny camera in it. The caregiver uses the sigmoidoscope to look at the last third of your colon.  Colonoscopy. This test is like sigmoidoscopy, but the caregiver looks at the entire colon. This is the most common method for finding and removing polyps. TREATMENT  Any polyps will be removed during a sigmoidoscopy or colonoscopy. The polyps are then tested for cancer. PREVENTION  To help lower your risk of getting more colon polyps:  Eat plenty of fruits and vegetables. Avoid eating fatty foods.  Do not smoke.  Avoid drinking alcohol.  Exercise every day.  Lose weight if recommended by your caregiver.  Eat plenty of calcium and folate. Foods that are rich in calcium include milk, cheese, and broccoli. Foods that are rich in folate include chickpeas, kidney beans, and  spinach. HOME CARE INSTRUCTIONS Keep all follow-up appointments as directed by your caregiver. You may need periodic exams to check for polyps. SEEK MEDICAL CARE IF: You notice bleeding during a bowel movement. Document Released: 06/17/2004 Document Revised: 12/14/2011 Document Reviewed: 12/01/2011 [facility] Patient Information 2013 [place], [place].

## 2013-08-24 ENCOUNTER — Ambulatory Visit (INDEPENDENT_AMBULATORY_CARE_PROVIDER_SITE_OTHER): Payer: BC Managed Care – PPO | Admitting: Gastroenterology

## 2013-08-24 ENCOUNTER — Encounter: Payer: Self-pay | Admitting: Gastroenterology

## 2013-08-24 VITALS — BP 120/78 | HR 77 | Ht 61.0 in | Wt 139.6 lb

## 2013-08-24 DIAGNOSIS — K6289 Other specified diseases of anus and rectum: Secondary | ICD-10-CM

## 2013-08-24 DIAGNOSIS — K625 Hemorrhage of anus and rectum: Secondary | ICD-10-CM

## 2013-08-24 DIAGNOSIS — K5120 Ulcerative (chronic) proctitis without complications: Secondary | ICD-10-CM

## 2013-08-24 MED ORDER — MESALAMINE 1000 MG RE SUPP: 1000.0000 mg | Freq: Every day | RECTAL | Status: DC

## 2013-08-24 NOTE — Patient Instructions (Signed)
You have been scheduled for a flexible sigmoidoscopy. Please follow the written instructions given to you at your visit today. If you use inhalers (even only as needed), please bring them with you on the day of your procedure.  We have sent the following medications to your pharmacy for you to pick up at your convenience: Canasa Suppositories

## 2013-08-24 NOTE — Progress Notes (Signed)
This is a 42-year-old patient has had recurrent rectal bleeding for the last year. Colonoscopy in February showed ulcerative proctitis, and biopsy suggest ischemia. There also was a flat adenomatous cecal polyp removed. She did well on Cortizone-Rowasa suppositories but has had relapse on and off for the last several months of tenesmus, rectal bleeding, and constipation with some abdominal and rectal pain. Her history otherwise is unremarkable as she denies upper GI or hepatobiliary complaints. She does not take NSAIDs, cigarettes or alcohol. Is been no anorexia, weight loss, or systemic complaints. She denies local trauma. Family history is noncontributory.  Current Medications, Allergies, Past Medical History, Past Surgical History, Family History and Social History were reviewed in [employer] record.  ROS: All systems were reviewed and are negative unless otherwise stated in the HPI.          Physical Exam: At pressure 120/78, pulse 77 and regular and weight 139. Abdominal exam is unremarkable. Also inspection of the rectum is unremarkable without fissures or fistulae. Rectal exam showed no masses or tenderness but bloody mucousy stool in the rectal vault. 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 I placed her on Canasa 1 g suppositories at bedtime and we'll do flexible sigmoidoscopy for better delineation of her colitis. She may need oral and topical aminosalicylate therapy. The biopsies will be performed.

## 2013-08-25 ENCOUNTER — Ambulatory Visit (AMBULATORY_SURGERY_CENTER): Payer: BC Managed Care – PPO | Admitting: Gastroenterology

## 2013-08-25 ENCOUNTER — Encounter: Payer: Self-pay | Admitting: Gastroenterology

## 2013-08-25 VITALS — BP 90/54 | HR 71 | Temp 96.4°F | Resp 13 | Ht 61.0 in | Wt 139.0 lb

## 2013-08-25 DIAGNOSIS — K6289 Other specified diseases of anus and rectum: Secondary | ICD-10-CM

## 2013-08-25 DIAGNOSIS — K625 Hemorrhage of anus and rectum: Secondary | ICD-10-CM

## 2013-08-25 DIAGNOSIS — R198 Other specified symptoms and signs involving the digestive system and abdomen: Secondary | ICD-10-CM

## 2013-08-25 DIAGNOSIS — R194 Change in bowel habit: Secondary | ICD-10-CM

## 2013-08-25 MED ORDER — SODIUM CHLORIDE 0.9 % IV SOLN: 500.0000 mL | INTRAVENOUS | Status: DC

## 2013-08-25 MED ORDER — MESALAMINE 1000 MG RE SUPP: 1000.0000 mg | Freq: Every day | RECTAL | Status: DC

## 2013-08-25 NOTE — Progress Notes (Signed)
Called to room to assist during endoscopic procedure.  Patient ID and intended procedure confirmed with present staff. Received instructions for my participation in the procedure from the performing physician.  

## 2013-08-25 NOTE — Op Note (Signed)
[facility] [address] [place], [address]   FLEXIBLE SIGMOIDOSCOPY PROCEDURE REPORT  PATIENT: [name], [name]  MR#: [id number] BIRTHDATE: 11/22/1970 , 41  yrs. old GENDER: Female ENDOSCOPIST: Mardella Layman, MD, [facility] REFERRED BY: PROCEDURE DATE:  08/25/2013 PROCEDURE:   Sigmoidoscopy with biopsy ASA CLASS:   Class I INDICATIONS:rectal bleeding. MEDICATIONS: propofol (Diprivan) 150mg  IV  DESCRIPTION OF PROCEDURE:   After the risks benefits and alternatives of the procedure were thoroughly explained, informed consent was obtained.  revealed no abnormalities of the rectum. The LB PFC-H190 [id number]  endoscope was introduced through the anus  and advanced to the splenic flexure , limited by No adverse events experienced.   The quality of the prep was excellent .  The instrument was then slowly withdrawn as the mucosa was fully examined.         COLON FINDINGS: The colonic mucosa appeared normal.From 0-5 cm. there was granular red  inflammed mucosa biopsied....see puictures/////// Retroflexed views revealed no abnormalities.    The scope was then withdrawn from the patient and the procedure terminated.  COMPLICATIONS: There were no complications.  ENDOSCOPIC IMPRESSION: The colonic mucosa appeared normal ..Marland Kitchenproctitis from 0-5 cm. level,mild disease,no progression from prior relapse,.  RECOMMENDATIONS: 1.  continue current meds.Marland KitchenMarland KitchenCanasa 1 gm Supp qhs #30,refill x6. 2.  await biopsy results   REPEAT EXAM: standard discharge  _______________________________ eSigned:  Mardella Layman, MD, [facility] 08/25/2013 3:32 PM   CC:

## 2013-08-25 NOTE — Patient Instructions (Signed)
YOU HAD AN ENDOSCOPIC PROCEDURE TODAY AT THE  ENDOSCOPY CENTER: Refer to the procedure report that was given to you for any specific questions about what was found during the examination.  If the procedure report does not answer your questions, please call your gastroenterologist to clarify.  If you requested that your care partner not be given the details of your procedure findings, then the procedure report has been included in a sealed envelope for you to review at your convenience later.  YOU SHOULD EXPECT: Some feelings of bloating in the abdomen. Passage of more gas than usual.  Walking can help get rid of the air that was put into your GI tract during the procedure and reduce the bloating. If you had a lower endoscopy (such as a colonoscopy or flexible sigmoidoscopy) you may notice spotting of blood in your stool or on the toilet paper. If you underwent a bowel prep for your procedure, then you may not have a normal bowel movement for a few days.  DIET: Your first meal following the procedure should be a light meal and then it is ok to progress to your normal diet.  A half-sandwich or bowl of soup is an example of a good first meal.  Heavy or fried foods are harder to digest and may make you feel nauseous or bloated.  Likewise meals heavy in dairy and vegetables can cause extra gas to form and this can also increase the bloating.  Drink plenty of fluids but you should avoid alcoholic beverages for 24 hours.  ACTIVITY: Your care partner should take you home directly after the procedure.  You should plan to take it easy, moving slowly for the rest of the day.  You can resume normal activity the day after the procedure however you should NOT DRIVE or use heavy machinery for 24 hours (because of the sedation medicines used during the test).    SYMPTOMS TO REPORT IMMEDIATELY: A gastroenterologist can be reached at any hour.  During normal business hours, 8:30 AM to 5:00 PM Monday through Friday,  call [phone].  After hours and on weekends, please call the GI answering service at [phone] who will take a message and have the physician on call contact you.   Following lower endoscopy (colonoscopy or flexible sigmoidoscopy):  Excessive amounts of blood in the stool  Significant tenderness or worsening of abdominal pains  Swelling of the abdomen that is new, acute  Fever of 100F or higher  FOLLOW UP: If any biopsies were taken you will be contacted by phone or by letter within the next 1-3 weeks.  Call your gastroenterologist if you have not heard about the biopsies in 3 weeks.  Our staff will call the home number listed on your records the next business day following your procedure to check on you and address any questions or concerns that you may have at that time regarding the information given to you following your procedure. This is a courtesy call and so if there is no answer at the home number and we have not heard from you through the emergency physician on call, we will assume that you have returned to your regular daily activities without incident.  SIGNATURES/CONFIDENTIALITY: You and/or your care partner have signed paperwork which will be entered into your electronic medical record.  These signatures attest to the fact that that the information above on your After Visit Summary has been reviewed and is understood.  Full responsibility of the confidentiality of this  discharge information lies with you and/or your care-partner.   Continue current medications- Canasa 1gm. Suppository at bedtime.  Await biopsy results.  Proctitis information given.
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
## 2013-08-28 ENCOUNTER — Telehealth: Payer: Self-pay | Admitting: *Deleted

## 2013-08-28 NOTE — Telephone Encounter (Signed)
  Follow up Call-  Call back number 08/25/2013 08/25/2013 12/13/2012  Post procedure Call Back phone  # - [phone]  Permission to leave phone message Yes Yes Yes     Patient questions:  Do you have a fever, pain , or abdominal swelling? no Pain Score  0 *  Have you tolerated food without any problems? yes  Have you been able to return to your normal activities? yes  Do you have any questions about your discharge instructions: Diet   no Medications  no Follow up visit  no  Do you have questions or concerns about your Care? no  Actions: * If pain score is 4 or above: No action needed, pain <4.

## 2013-08-29 ENCOUNTER — Encounter: Payer: Self-pay | Admitting: Gastroenterology

## 2013-09-04 ENCOUNTER — Encounter: Payer: Self-pay | Admitting: Gastroenterology

## 2013-09-14 ENCOUNTER — Other Ambulatory Visit: Payer: Self-pay | Admitting: *Deleted

## 2013-09-14 MED ORDER — HYDROCORTISONE 2.5 % RE CREA: 1.0000 "application " | TOPICAL_CREAM | Freq: Two times a day (BID) | RECTAL | Status: DC

## 2014-03-28 IMAGING — CR DG ABDOMEN 1V
1 series · 1 of 1 positions shown · non-contrast
Comparison: None.

CLINICAL DATA: Abdominal pain, constipation

ABDOMEN - 1 VIEW

[view not recorded]
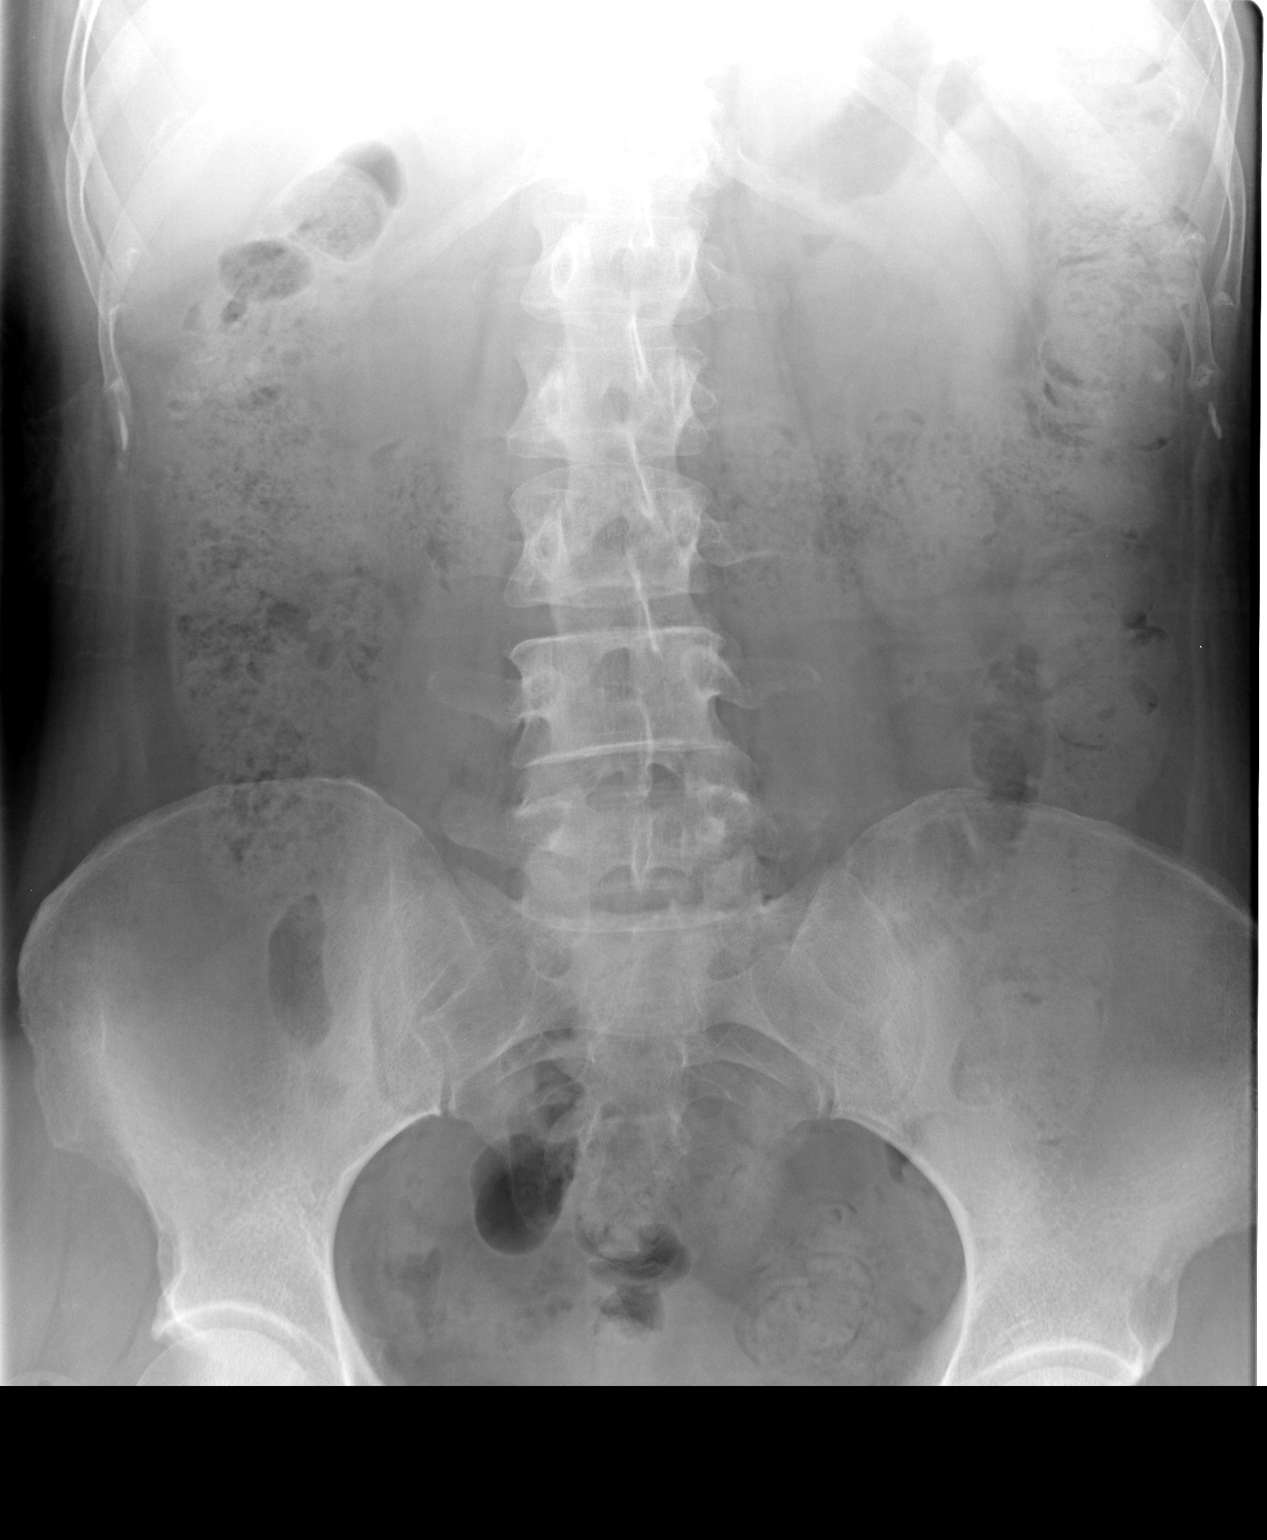

[1 of 1 positions shown; findings below may reference images not displayed]

FINDINGS: Moderate stool volume noted throughout nondilated colon.
No abnormal calcific opacity.  Minimal rightward lumbar spine
curvature centered at L3 noted. Presence or absence of air fluid
levels or free air cannot be assessed on this single supine view.
No dilated gas filled loop of bowel is identified.
IMPRESSION: Moderate stool volume throughout nondilated colon.  Nonobstructive
bowel gas pattern.

## 2014-05-10 ENCOUNTER — Encounter (INDEPENDENT_AMBULATORY_CARE_PROVIDER_SITE_OTHER): Payer: Self-pay | Admitting: *Deleted

## 2014-06-04 ENCOUNTER — Ambulatory Visit (INDEPENDENT_AMBULATORY_CARE_PROVIDER_SITE_OTHER): Payer: BC Managed Care – PPO | Admitting: Internal Medicine

## 2014-06-25 ENCOUNTER — Other Ambulatory Visit: Payer: Self-pay | Admitting: Family Medicine

## 2014-06-25 ENCOUNTER — Other Ambulatory Visit (HOSPITAL_COMMUNITY): Payer: Self-pay | Admitting: Family Medicine

## 2014-06-25 DIAGNOSIS — Z139 Encounter for screening, unspecified: Secondary | ICD-10-CM

## 2014-06-27 ENCOUNTER — Ambulatory Visit (HOSPITAL_COMMUNITY): Payer: BC Managed Care – PPO

## 2014-07-03 ENCOUNTER — Ambulatory Visit
Admission: RE | Admit: 2014-07-03 | Discharge: 2014-07-03 | Disposition: A | Discharge status: Home / Self Care | Payer: BC Managed Care – PPO | Source: Ambulatory Visit | Attending: Family Medicine | Admitting: Family Medicine

## 2014-07-03 DIAGNOSIS — Z139 Encounter for screening, unspecified: Secondary | ICD-10-CM

## 2014-08-28 ENCOUNTER — Ambulatory Visit (INDEPENDENT_AMBULATORY_CARE_PROVIDER_SITE_OTHER): Payer: BC Managed Care – PPO | Admitting: Internal Medicine

## 2015-02-01 ENCOUNTER — Other Ambulatory Visit: Payer: Self-pay | Admitting: Gastroenterology

## 2015-09-02 ENCOUNTER — Other Ambulatory Visit: Payer: Self-pay

## 2015-09-02 DIAGNOSIS — Z1231 Encounter for screening mammogram for malignant neoplasm of breast: Secondary | ICD-10-CM

## 2015-09-25 ENCOUNTER — Ambulatory Visit
Admission: RE | Admit: 2015-09-25 | Discharge: 2015-09-25 | Disposition: A | Discharge status: Home / Self Care | Payer: BLUE CROSS/BLUE SHIELD | Source: Ambulatory Visit

## 2015-09-25 DIAGNOSIS — Z1231 Encounter for screening mammogram for malignant neoplasm of breast: Secondary | ICD-10-CM

## 2016-01-30 ENCOUNTER — Encounter: Payer: Self-pay | Admitting: Gastroenterology

## 2016-08-18 ENCOUNTER — Other Ambulatory Visit: Payer: Self-pay | Admitting: Family Medicine

## 2016-08-18 DIAGNOSIS — Z1231 Encounter for screening mammogram for malignant neoplasm of breast: Secondary | ICD-10-CM

## 2016-09-25 ENCOUNTER — Ambulatory Visit: Payer: BLUE CROSS/BLUE SHIELD

## 2017-12-19 ENCOUNTER — Encounter: Payer: Self-pay | Admitting: Gastroenterology

## 2018-03-06 ENCOUNTER — Encounter (HOSPITAL_COMMUNITY): Payer: Self-pay | Admitting: Emergency Medicine

## 2018-03-06 ENCOUNTER — Emergency Department (HOSPITAL_COMMUNITY)
Admission: EM | Admit: 2018-03-06 | Discharge: 2018-03-06 | Disposition: A | Discharge status: Home / Self Care | Payer: BLUE CROSS/BLUE SHIELD | Attending: Emergency Medicine | Admitting: Emergency Medicine

## 2018-03-06 ENCOUNTER — Emergency Department (HOSPITAL_COMMUNITY): Payer: BLUE CROSS/BLUE SHIELD

## 2018-03-06 DIAGNOSIS — S20212A Contusion of left front wall of thorax, initial encounter: Secondary | ICD-10-CM | POA: Diagnosis not present

## 2018-03-06 DIAGNOSIS — M545 Low back pain, unspecified: Secondary | ICD-10-CM

## 2018-03-06 DIAGNOSIS — Y92410 Unspecified street and highway as the place of occurrence of the external cause: Secondary | ICD-10-CM | POA: Insufficient documentation

## 2018-03-06 DIAGNOSIS — G8929 Other chronic pain: Secondary | ICD-10-CM | POA: Diagnosis not present

## 2018-03-06 DIAGNOSIS — Y9389 Activity, other specified: Secondary | ICD-10-CM | POA: Insufficient documentation

## 2018-03-06 DIAGNOSIS — S299XXA Unspecified injury of thorax, initial encounter: Secondary | ICD-10-CM | POA: Diagnosis present

## 2018-03-06 DIAGNOSIS — S9032XA Contusion of left foot, initial encounter: Secondary | ICD-10-CM | POA: Insufficient documentation

## 2018-03-06 DIAGNOSIS — Y998 Other external cause status: Secondary | ICD-10-CM | POA: Diagnosis not present

## 2018-03-06 MED ORDER — HYDROCODONE-ACETAMINOPHEN 5-325 MG PO TABS
1.0000 | ORAL_TABLET | Freq: Once | ORAL | Status: AC
  Administered 2018-03-06: 1 via ORAL
  Filled 2018-03-06: qty 1

## 2018-03-06 MED ORDER — CYCLOBENZAPRINE HCL 10 MG PO TABS: 10.0000 mg | ORAL_TABLET | Freq: Every evening | ORAL | 0 refills | Status: DC | PRN

## 2018-03-06 NOTE — Progress Notes (Signed)
Orthopedic Tech Progress Note Patient Details:  [name] [birth year] [id number]  Ortho Devices Type of Ortho Device: Ace wrap Ortho Device/Splint Location: lle ankle ace wrap Ortho Device/Splint Interventions: Ordered, Application, Adjustment   Post Interventions Patient Tolerated: Well Instructions Provided: Care of device, Adjustment of device   Karolee Stamps 03/06/2018, 11:59 PM
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
## 2018-03-06 NOTE — Discharge Instructions (Signed)
Please read instructions below. Apply ice to your areas of pain for 20 minutes at a time. Elevate your foot when possible. You can take 600 mg of Advil/ibuprofen every 6 hours as needed for pain. You can take flexeril every 12 hours as needed for muscle spasm. Be aware this medication can make you drowsy. Schedule an appointment with your primary care provider to follow up on your visit today. Return to the ER for severely worsening headache, vision changes, shortness of breath, if new numbness or tingling in your arms or legs, inability to urinate, inability to hold your bowels, or weakness in your extremities.
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
## 2018-03-06 NOTE — ED Notes (Signed)
Patient transported to X-ray 

## 2018-03-07 ENCOUNTER — Encounter: Payer: Self-pay | Admitting: Gastroenterology

## 2018-07-21 ENCOUNTER — Encounter: Payer: Self-pay | Admitting: Gastroenterology

## 2018-08-16 ENCOUNTER — Ambulatory Visit (AMBULATORY_SURGERY_CENTER): Payer: Self-pay

## 2018-08-16 ENCOUNTER — Encounter: Payer: Self-pay | Admitting: Gastroenterology

## 2018-08-16 VITALS — Ht 64.0 in | Wt 145.0 lb

## 2018-08-16 DIAGNOSIS — Z86010 Personal history of colonic polyps: Secondary | ICD-10-CM

## 2018-08-16 MED ORDER — NA SULFATE-K SULFATE-MG SULF 17.5-3.13-1.6 GM/177ML PO SOLN: 1.0000 | Freq: Once | ORAL | 0 refills | Status: AC

## 2018-08-16 NOTE — Progress Notes (Signed)
Denies allergies to eggs or soy products. Denies complication of anesthesia or sedation. Denies use of weight loss medication. Denies use of O2.   Emmi instructions declined.  A 15.00 coupon for Suprep was given to the patient.

## 2018-08-22 ENCOUNTER — Ambulatory Visit (AMBULATORY_SURGERY_CENTER): Payer: 59 | Admitting: Gastroenterology

## 2018-08-22 ENCOUNTER — Encounter: Payer: Self-pay | Admitting: Gastroenterology

## 2018-08-22 VITALS — BP 103/61 | HR 71 | Temp 98.4°F | Resp 12

## 2018-08-22 DIAGNOSIS — K629 Disease of anus and rectum, unspecified: Secondary | ICD-10-CM

## 2018-08-22 DIAGNOSIS — Z86010 Personal history of colonic polyps: Secondary | ICD-10-CM

## 2018-08-22 MED ORDER — SODIUM CHLORIDE 0.9 % IV SOLN: 500.0000 mL | Freq: Once | INTRAVENOUS | Status: DC

## 2018-08-22 NOTE — Patient Instructions (Signed)
Discharge instructions given. Biopsies taken. Anorectal manometry to be scheduled for evaluation of weak and sphincter and rectal prolapse. Resume previous medications. YOU HAD AN ENDOSCOPIC PROCEDURE TODAY AT [facility]:   Refer to the procedure report that was given to you for any specific questions about what was found during the examination.  If the procedure report does not answer your questions, please call your gastroenterologist to clarify.  If you requested that your care partner not be given the details of your procedure findings, then the procedure report has been included in a sealed envelope for you to review at your convenience later.  YOU SHOULD EXPECT: Some feelings of bloating in the abdomen. Passage of more gas than usual.  Walking can help get rid of the air that was put into your GI tract during the procedure and reduce the bloating. If you had a lower endoscopy (such as a colonoscopy or flexible sigmoidoscopy) you may notice spotting of blood in your stool or on the toilet paper. If you underwent a bowel prep for your procedure, you may not have a normal bowel movement for a few days.  Please Note:  You might notice some irritation and congestion in your nose or some drainage.  This is from the oxygen used during your procedure.  There is no need for concern and it should clear up in a day or so.  SYMPTOMS TO REPORT IMMEDIATELY:   Following lower endoscopy (colonoscopy or flexible sigmoidoscopy):  Excessive amounts of blood in the stool  Significant tenderness or worsening of abdominal pains  Swelling of the abdomen that is new, acute  Fever of 100F or higher   For urgent or emergent issues, a gastroenterologist can be reached at any hour by calling [phone].   DIET:  We do recommend a small meal at first, but then you may proceed to your regular diet.  Drink plenty of fluids but you should avoid alcoholic beverages for 24 hours.  ACTIVITY:  You  should plan to take it easy for the rest of today and you should NOT DRIVE or use heavy machinery until tomorrow (because of the sedation medicines used during the test).    FOLLOW UP: Our staff will call the number listed on your records the next business day following your procedure to check on you and address any questions or concerns that you may have regarding the information given to you following your procedure. If we do not reach you, we will leave a message.  However, if you are feeling well and you are not experiencing any problems, there is no need to return our call.  We will assume that you have returned to your regular daily activities without incident.  If any biopsies were taken you will be contacted by phone or by letter within the next 1-3 weeks.  Please call us at [phone] if you have not heard about the biopsies in 3 weeks.    SIGNATURES/CONFIDENTIALITY: You and/or your care partner have signed paperwork which will be entered into your electronic medical record.  These signatures attest to the fact that that the information above on your After Visit Summary has been reviewed and is understood.  Full responsibility of the confidentiality of this discharge information lies with you and/or your care-partner.

## 2018-08-22 NOTE — Progress Notes (Signed)
Pt's states no medical or surgical changes since previsit or office visit. 

## 2018-08-22 NOTE — Op Note (Signed)
[facility] Patient Name: [name] Procedure Date: 08/22/2018 3:04 PM MRN: [id number] Endoscopist: Mauri Pole , MD Age: 47 Referring MD:  Date of Birth: [birth year] Gender: Female Account #: [id number] Procedure:                Colonoscopy Indications:              High risk colon cancer surveillance: Personal                            history of adenoma less than 10 mm in size Medicines:                Monitored Anesthesia Care Procedure:                Pre-Anesthesia Assessment:                           - Prior to the procedure, a History and Physical                            was performed, and patient medications and                            allergies were reviewed. The patient's tolerance of                            previous anesthesia was also reviewed. The risks                            and benefits of the procedure and the sedation                            options and risks were discussed with the patient.                            All questions were answered, and informed consent                            was obtained. Prior Anticoagulants: The patient has                            taken no previous anticoagulant or antiplatelet                            agents. ASA Grade Assessment: II - A patient with                            mild systemic disease. After reviewing the risks                            and benefits, the patient was deemed in                            satisfactory condition to undergo the procedure.  After obtaining informed consent, the colonoscope                            was passed under direct vision. Throughout the                            procedure, the patient's blood pressure, pulse, and                            oxygen saturations were monitored continuously. The                            Colonoscope was introduced through the anus and                            advanced to  the the cecum, identified by                            appendiceal orifice and ileocecal valve. The                            colonoscopy was performed without difficulty. The                            patient tolerated the procedure well. The quality                            of the bowel preparation was excellent. The                            ileocecal valve, appendiceal orifice, and rectum                            were photographed. Scope In: 3:15:13 PM Scope Out: 3:32:14 PM Scope Withdrawal Time: 0 hours 10 minutes 16 seconds  Total Procedure Duration: 0 hours 17 minutes 1 second  Findings:                 The digital rectal exam findings include decreased                            sphincter tone and rectal prolapse reducible.                           A large polypoid lesion was found in the mid rectum                            and in the distal rectum. The lesion was polypoid.                            No bleeding was present. Biopsies were taken with a                            cold forceps for histology. Retroflexion could not  be performed in the rectum due to weak sphincter                            and large polypoid lesion.                           The exam was otherwise without abnormality. Complications:            No immediate complications. Estimated Blood Loss:     Estimated blood loss was minimal. Impression:               - Decreased sphincter tone and rectal prolapse                            reducible found on digital rectal exam.                           - Likely benign polypoid lesion in the mid rectum                            and in the distal rectum. Biopsied.                           - The examination was otherwise normal. Recommendation:           - Patient has a contact number available for                            emergencies. The signs and symptoms of potential                            delayed complications  were discussed with the                            patient. Return to normal activities tomorrow.                            Written discharge instructions were provided to the                            patient.                           - Resume previous diet.                           - Continue present medications.                           - Await pathology results.                           - Anorectal manometry to be scheduled for                            evaluation of weak anal sphincter and rectal  prolapse Mauri Pole, MD 08/22/2018 3:41:49 PM This report has been signed electronically.

## 2018-08-22 NOTE — Progress Notes (Signed)
Called to room to assist during endoscopic procedure.  Patient ID and intended procedure confirmed with present staff. Received instructions for my participation in the procedure from the performing physician.  

## 2018-08-22 NOTE — Progress Notes (Signed)
PT taken to PACU. Monitors in place. VSS. Report given to RN. 

## 2018-08-23 ENCOUNTER — Telehealth: Payer: Self-pay | Admitting: *Deleted

## 2018-08-23 NOTE — Telephone Encounter (Signed)
  Follow up Call-  Call back number 08/22/2018  Post procedure Call Back phone  # [phone]  Permission to leave phone message Yes  Some recent data might be hidden     Patient questions:  Do you have a fever, pain , or abdominal swelling? No. Pain Score  0 *  Have you tolerated food without any problems? Yes.    Have you been able to return to your normal activities? Yes.    Do you have any questions about your discharge instructions: Diet   No. Medications  No. Follow up visit  No.  Do you have questions or concerns about your Care? No.  Actions: * If pain score is 4 or above: No action needed, pain <4.

## 2018-08-30 ENCOUNTER — Other Ambulatory Visit: Payer: Self-pay
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
